# Patient Record
Sex: Female | Born: 1950 | Race: White | Hispanic: No
Health system: Southern US, Community
[De-identification: ages and names within clinical notes are randomized; demographics above are authoritative.]

---

## 2001-07-18 ENCOUNTER — Ambulatory Visit (HOSPITAL_COMMUNITY): Admission: RE | Admit: 2001-07-18 | Discharge: 2001-07-18 | Payer: Self-pay | Admitting: Family Medicine

## 2001-07-18 ENCOUNTER — Encounter: Payer: Self-pay | Admitting: Family Medicine

## 2003-05-21 ENCOUNTER — Ambulatory Visit (HOSPITAL_COMMUNITY): Admission: RE | Admit: 2003-05-21 | Discharge: 2003-05-21 | Payer: Self-pay | Admitting: Family Medicine

## 2003-05-21 ENCOUNTER — Encounter: Payer: Self-pay | Admitting: Family Medicine

## 2003-07-12 ENCOUNTER — Ambulatory Visit (HOSPITAL_COMMUNITY): Admission: RE | Admit: 2003-07-12 | Discharge: 2003-07-12 | Payer: Self-pay | Admitting: Family Medicine

## 2003-07-12 ENCOUNTER — Encounter: Payer: Self-pay | Admitting: Family Medicine

## 2004-07-28 ENCOUNTER — Other Ambulatory Visit: Admission: RE | Admit: 2004-07-28 | Discharge: 2004-07-28 | Payer: Self-pay | Admitting: Dermatology

## 2009-06-13 ENCOUNTER — Ambulatory Visit (HOSPITAL_COMMUNITY): Admission: RE | Admit: 2009-06-13 | Discharge: 2009-06-13 | Payer: Self-pay | Admitting: Family Medicine

## 2009-07-25 ENCOUNTER — Encounter: Payer: Self-pay | Admitting: Gastroenterology

## 2009-07-31 ENCOUNTER — Telehealth (INDEPENDENT_AMBULATORY_CARE_PROVIDER_SITE_OTHER): Payer: Self-pay

## 2009-12-10 ENCOUNTER — Ambulatory Visit (HOSPITAL_COMMUNITY): Admission: RE | Admit: 2009-12-10 | Discharge: 2009-12-10 | Payer: Self-pay | Admitting: Family Medicine

## 2010-07-07 ENCOUNTER — Encounter (INDEPENDENT_AMBULATORY_CARE_PROVIDER_SITE_OTHER): Payer: Self-pay | Admitting: *Deleted

## 2010-07-17 ENCOUNTER — Encounter: Admission: RE | Admit: 2010-07-17 | Discharge: 2010-07-17 | Payer: Self-pay | Admitting: Otolaryngology

## 2011-01-05 NOTE — Letter (Signed)
Summary: Previsit letter  [facility]  [address], [place] [address]   Phone: [phone]  Fax: [phone]       07/07/2010 MRN: [id number]  [facility] [address] [place], [place]  [address]  Dear Casey Hernandez,  Welcome to the Gastroenterology Division at [facility].    You are scheduled to see a nurse for your pre-procedure visit on 08-17-10 at 9:00a.m. on the 3rd floor at [facility], [address]  We ask that you try to arrive at our office 15 minutes prior to your appointment time to allow for check-in.  Your nurse visit will consist of discussing your medical and surgical history, your immediate family medical history, and your medications.    Please bring a complete list of all your medications or, if you prefer, bring the medication bottles and we will list them.  We will need to be aware of both prescribed and over the counter drugs.  We will need to know exact dosage information as well.  If you are on blood thinners (Coumadin, Plavix, Aggrenox, Ticlid, etc.) please call our office today/prior to your appointment, as we need to consult with your physician about holding your medication.   Please be prepared to read and sign documents such as consent forms, a financial agreement, and acknowledgement forms.  If necessary, and with your consent, a friend or relative is welcome to sit-in on the nurse visit with you.  Please bring your insurance card so that we may make a copy of it.  If your insurance requires a referral to see a specialist, please bring your referral form from your primary care physician.  No co-pay is required for this nurse visit.     If you cannot keep your appointment, please call [phone] to cancel or reschedule prior to your appointment date.  This allows [place] the opportunity to schedule an appointment for another patient in need of care.    Thank you for choosing [facility] for your medical needs.  We  appreciate the opportunity to care for you.  Please visit [place] at our website  to learn more about our practice.                     Sincerely.                                                                                                                   The Gastroenterology Division

## 2014-08-16 ENCOUNTER — Other Ambulatory Visit: Payer: Self-pay

## 2014-08-16 DIAGNOSIS — Z1231 Encounter for screening mammogram for malignant neoplasm of breast: Secondary | ICD-10-CM

## 2014-09-12 ENCOUNTER — Ambulatory Visit
Admission: RE | Admit: 2014-09-12 | Discharge: 2014-09-12 | Disposition: A | Discharge status: Home / Self Care | Payer: Medicare Other | Source: Ambulatory Visit

## 2014-09-12 DIAGNOSIS — Z1231 Encounter for screening mammogram for malignant neoplasm of breast: Secondary | ICD-10-CM | POA: Diagnosis not present

## 2015-03-11 DIAGNOSIS — Z0000 Encounter for general adult medical examination without abnormal findings: Secondary | ICD-10-CM | POA: Diagnosis not present

## 2015-03-11 DIAGNOSIS — Z6824 Body mass index (BMI) 24.0-24.9, adult: Secondary | ICD-10-CM | POA: Diagnosis not present

## 2015-03-19 ENCOUNTER — Encounter (INDEPENDENT_AMBULATORY_CARE_PROVIDER_SITE_OTHER): Payer: Self-pay | Admitting: *Deleted

## 2015-04-14 ENCOUNTER — Other Ambulatory Visit (HOSPITAL_COMMUNITY): Payer: Self-pay | Admitting: Family Medicine

## 2015-04-14 ENCOUNTER — Ambulatory Visit (HOSPITAL_COMMUNITY)
Admission: RE | Admit: 2015-04-14 | Discharge: 2015-04-14 | Disposition: A | Discharge status: Home / Self Care | Payer: Medicare Other | Source: Ambulatory Visit | Attending: Family Medicine | Admitting: Family Medicine

## 2015-04-14 DIAGNOSIS — M79641 Pain in right hand: Secondary | ICD-10-CM

## 2015-04-14 DIAGNOSIS — S62316A Displaced fracture of base of fifth metacarpal bone, right hand, initial encounter for closed fracture: Secondary | ICD-10-CM | POA: Diagnosis not present

## 2015-04-14 DIAGNOSIS — W19XXXA Unspecified fall, initial encounter: Secondary | ICD-10-CM | POA: Diagnosis not present

## 2015-04-14 DIAGNOSIS — E663 Overweight: Secondary | ICD-10-CM | POA: Diagnosis not present

## 2015-04-14 DIAGNOSIS — S62320A Displaced fracture of shaft of second metacarpal bone, right hand, initial encounter for closed fracture: Secondary | ICD-10-CM | POA: Diagnosis not present

## 2015-04-14 DIAGNOSIS — Z6825 Body mass index (BMI) 25.0-25.9, adult: Secondary | ICD-10-CM | POA: Diagnosis not present

## 2015-04-15 ENCOUNTER — Telehealth: Payer: Self-pay | Admitting: Orthopedic Surgery

## 2015-04-15 NOTE — Telephone Encounter (Signed)
i can see her tomorrow afternoon at 130

## 2015-04-15 NOTE — Telephone Encounter (Signed)
Referral received from primary care Dr. Molli Barrows, PA, for appointment as soon as possible, for problem hand fracture, 9-day old injury [place] beach.  Please review Xray films (done 04/14/15 at [facility]) and please advise regarding scheduling here (or hand specialist?)  Patient is currently in an ace bandage. Patient ph#'s [phone]/cell# [phone].

## 2015-04-15 NOTE — Telephone Encounter (Signed)
Spoke with patient scheduled appointment

## 2015-04-16 ENCOUNTER — Ambulatory Visit (INDEPENDENT_AMBULATORY_CARE_PROVIDER_SITE_OTHER): Payer: Medicare Other | Admitting: Orthopedic Surgery

## 2015-04-16 ENCOUNTER — Encounter: Payer: Self-pay | Admitting: Orthopedic Surgery

## 2015-04-16 VITALS — BP 156/82 | Ht 62.0 in | Wt 140.0 lb

## 2015-04-16 DIAGNOSIS — S62309A Unspecified fracture of unspecified metacarpal bone, initial encounter for closed fracture: Secondary | ICD-10-CM | POA: Diagnosis not present

## 2015-04-16 NOTE — Progress Notes (Signed)
Patient ID: Casey Hernandez, female   DOB: 1951-01-12, 64 y.o.   MRN: [id number]  Chief Complaint  Patient presents with  . Hand Injury    right hand fracture, DOI 04/05/15, REF FUSCO    History: See right hand injury on April 30. Patient fell over a [employer] executive. Landed on her right hand. Complains of constant sharp throbbing pain associated with swelling. She fell again on the ninth and had an x-ray and it showed a fracture at the base of the fifth metacarpal.  Review of Systems  All other systems reviewed and are negative.  She reports no medical history  In the late 90s she had surgery for ruptured disc in her neck  She's not on any medication  Codeine causes her to have up stat stomach and causes a feeling that her skin has something crawling on  Family history diabetes reflux heart failure hypertension and cancer  Social history denies smoking or drinking  She has stable vital signs she is awake alert and oriented 3 mood and affect are normal. Her ambulatory status is not in question but normal.  She has swelling and bruising over the dorsum right hand with tenderness at the carpometacarpal joint. But the joint is stable. Her motor functions intact her skin is bruised but intact other than a abrasion near the distal aspect of the metacarpal which has healed nicely. Sensation is normal to soft touch and she has good perfusion pulse and color. Epitrochlear lymph nodes are normal  X-rays were done at our [facility] in the shoulder base of the fifth metacarpal fracture  We put her in a warm and form multiple cast brace allow full range of motion x-rays in 3 weeks

## 2015-05-02 ENCOUNTER — Other Ambulatory Visit (HOSPITAL_COMMUNITY): Payer: Self-pay | Admitting: Family Medicine

## 2015-05-02 DIAGNOSIS — Z780 Asymptomatic menopausal state: Secondary | ICD-10-CM

## 2015-05-02 DIAGNOSIS — M8580 Other specified disorders of bone density and structure, unspecified site: Secondary | ICD-10-CM

## 2015-05-08 ENCOUNTER — Ambulatory Visit (INDEPENDENT_AMBULATORY_CARE_PROVIDER_SITE_OTHER): Payer: Medicare Other

## 2015-05-08 ENCOUNTER — Ambulatory Visit (INDEPENDENT_AMBULATORY_CARE_PROVIDER_SITE_OTHER): Payer: Self-pay | Admitting: Orthopedic Surgery

## 2015-05-08 VITALS — BP 136/75 | Ht 62.0 in | Wt 140.0 lb

## 2015-05-08 DIAGNOSIS — S62609D Fracture of unspecified phalanx of unspecified finger, subsequent encounter for fracture with routine healing: Secondary | ICD-10-CM | POA: Diagnosis not present

## 2015-05-08 DIAGNOSIS — S62318D Displaced fracture of base of other metacarpal bone, subsequent encounter for fracture with routine healing: Secondary | ICD-10-CM

## 2015-05-08 NOTE — Progress Notes (Signed)
Patient ID: Casey Hernandez, female   DOB: [birth year], 64 y.o.   MRN: [id number]  Chief Complaint  Patient presents with  . Follow-up    3 week recheck on right hand fracture with xray, DOI 04-05-15.    BP 136/75 mmHg  Ht 5\' 2"  (1.575 m)  Wt 140 lb (63.504 kg)  BMI 25.60 kg/m2  Encounter Diagnosis  Name Primary?  . Closed fracture of base of fifth metacarpal bone, with routine healing, subsequent encounter Yes    She still a little tender at the fracture site has full range of motion after being treated with a fracture multiple brace  X-rays were reviewed and comments and report are in the chart. She can remove the brace follow-up as needed don't lift anything heavy over the next 2 weeks.

## 2015-05-09 ENCOUNTER — Ambulatory Visit (HOSPITAL_COMMUNITY)
Admission: RE | Admit: 2015-05-09 | Discharge: 2015-05-09 | Disposition: A | Discharge status: Home / Self Care | Payer: Medicare Other | Source: Ambulatory Visit | Attending: Family Medicine | Admitting: Family Medicine

## 2015-05-09 DIAGNOSIS — Z780 Asymptomatic menopausal state: Secondary | ICD-10-CM | POA: Diagnosis not present

## 2015-05-09 DIAGNOSIS — M8580 Other specified disorders of bone density and structure, unspecified site: Secondary | ICD-10-CM | POA: Diagnosis not present

## 2015-05-09 DIAGNOSIS — M810 Age-related osteoporosis without current pathological fracture: Secondary | ICD-10-CM | POA: Diagnosis not present

## 2016-02-20 ENCOUNTER — Other Ambulatory Visit: Payer: Self-pay

## 2016-02-20 DIAGNOSIS — Z1231 Encounter for screening mammogram for malignant neoplasm of breast: Secondary | ICD-10-CM

## 2016-03-08 ENCOUNTER — Ambulatory Visit
Admission: RE | Admit: 2016-03-08 | Discharge: 2016-03-08 | Disposition: A | Discharge status: Home / Self Care | Payer: Medicare Other | Source: Ambulatory Visit

## 2016-03-08 DIAGNOSIS — Z1231 Encounter for screening mammogram for malignant neoplasm of breast: Secondary | ICD-10-CM | POA: Diagnosis not present

## 2016-03-08 IMAGING — MG MM SCREENING BREAST TOMO BILATERAL
8 of 12 series · 8 of 28 positions shown · non-contrast
Comparison: Previous exam(s).

CLINICAL DATA: Screening.

EXAM:
2D DIGITAL SCREENING BILATERAL MAMMOGRAM WITH CAD AND ADJUNCT TOMO

[L MLO synth-2D]
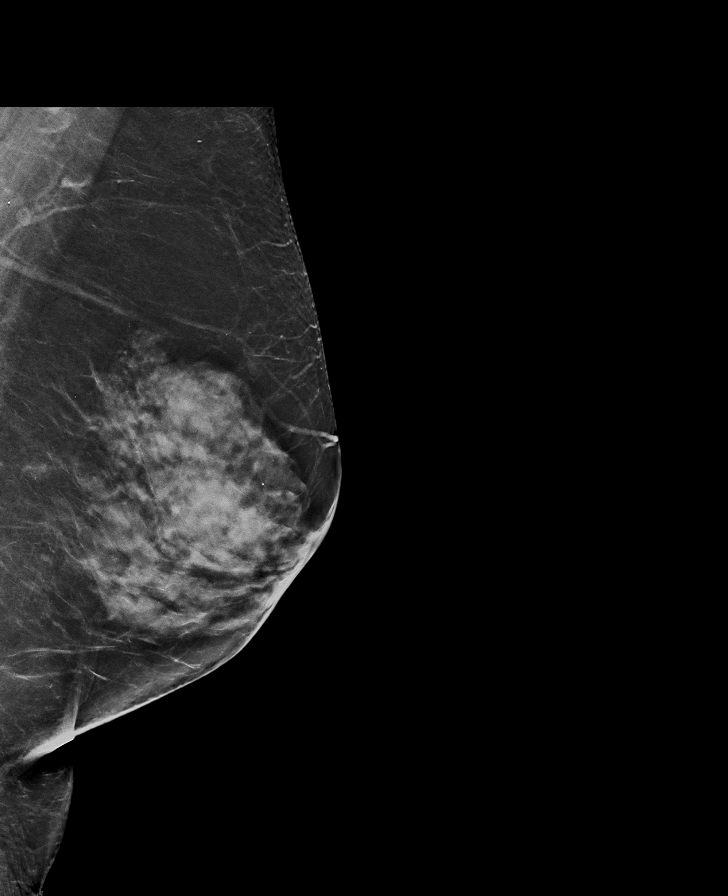

[L MLO]
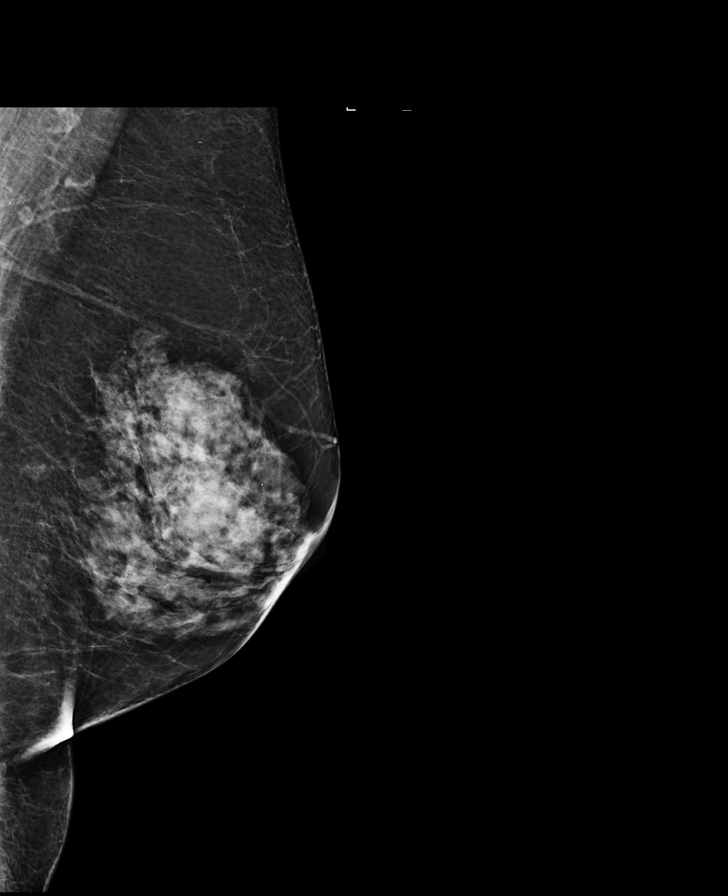

[L CC synth-2D]
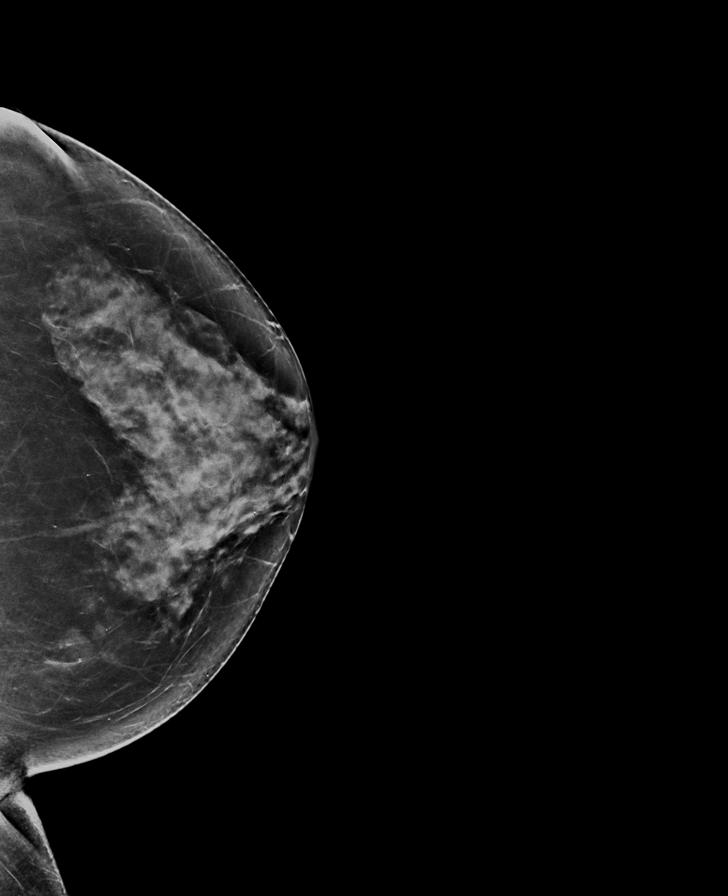

[R CC synth-2D]
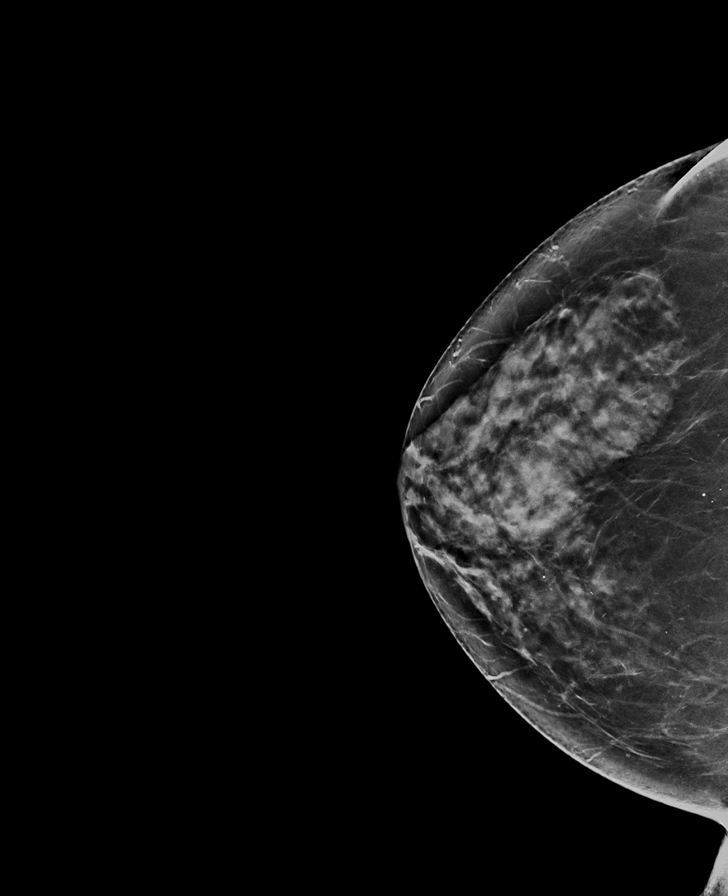

[L CC]
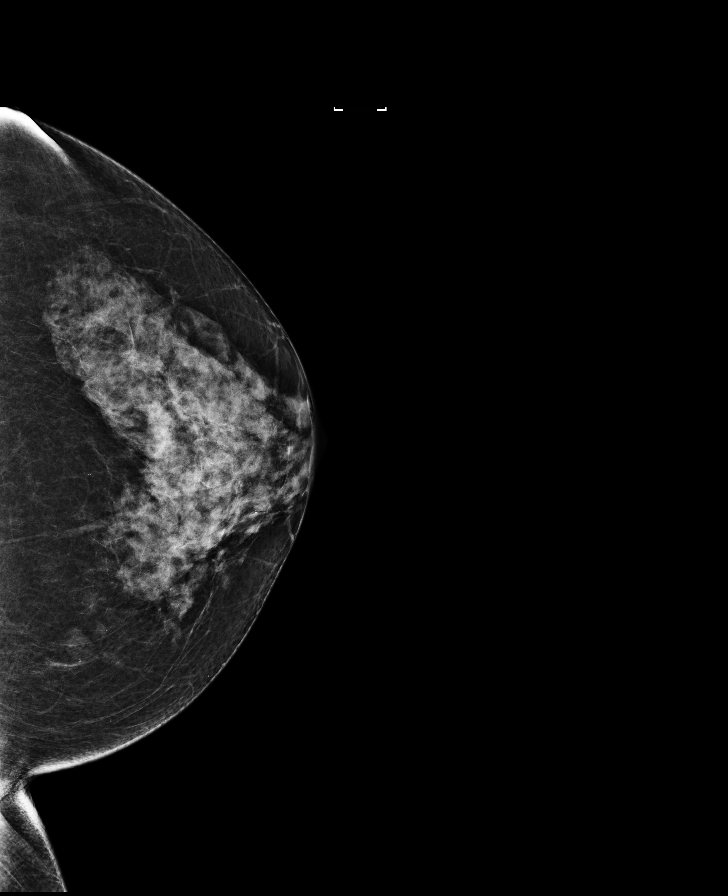

[R MLO]
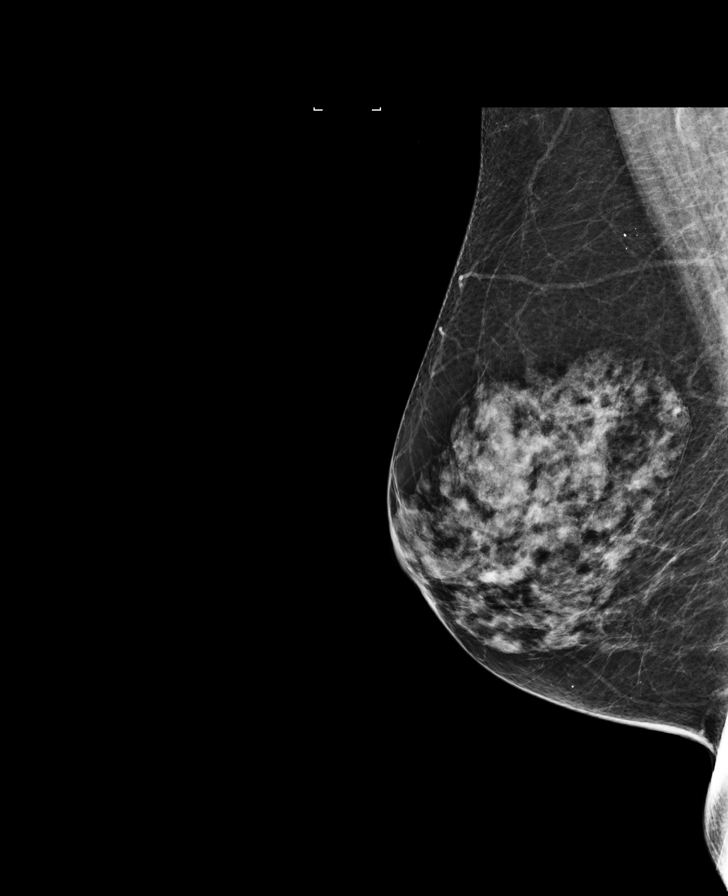

[R MLO synth-2D]
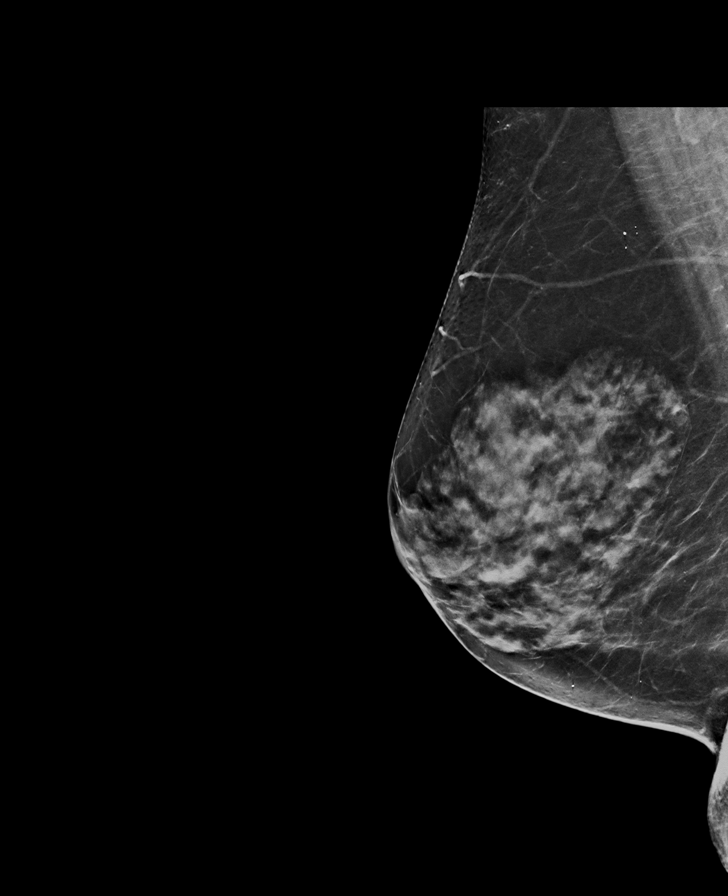

[R CC]
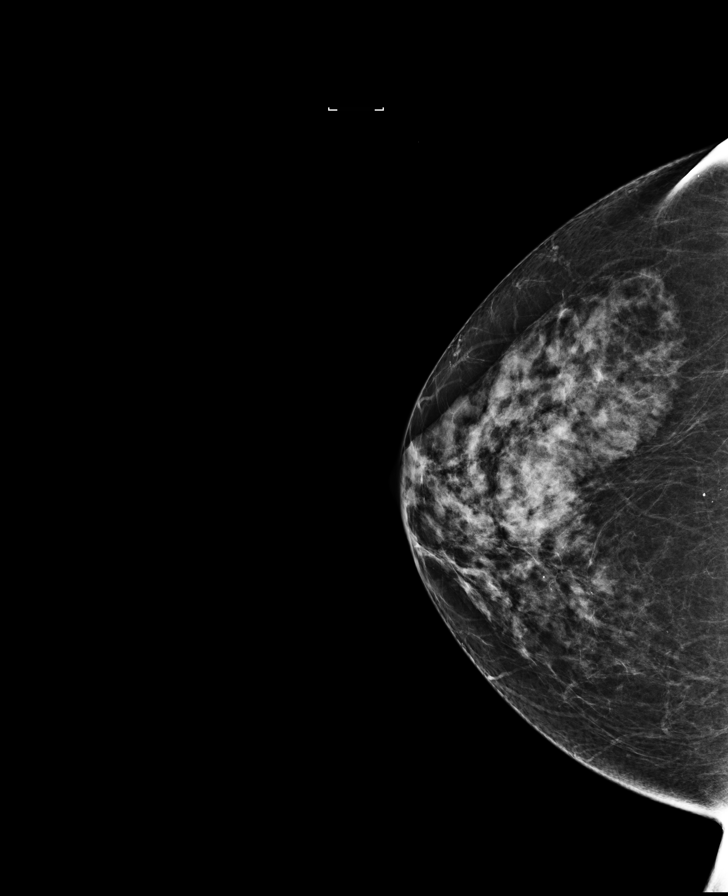

[8 of 28 positions shown; findings below may reference images not displayed]

ACR Breast Density Category c: The breast tissue is heterogeneously
dense, which may obscure small masses.
FINDINGS: There are no findings suspicious for malignancy. Images were
processed with CAD.
IMPRESSION: No mammographic evidence of malignancy. A result letter of this
screening mammogram will be mailed directly to the patient.

RECOMMENDATION:
Screening mammogram in one year. (Code:[TA])

BI-RADS CATEGORY  1: Negative.

## 2016-04-01 DIAGNOSIS — Z6825 Body mass index (BMI) 25.0-25.9, adult: Secondary | ICD-10-CM | POA: Diagnosis not present

## 2016-04-01 DIAGNOSIS — E663 Overweight: Secondary | ICD-10-CM | POA: Diagnosis not present

## 2016-04-01 DIAGNOSIS — Z1389 Encounter for screening for other disorder: Secondary | ICD-10-CM | POA: Diagnosis not present

## 2016-04-01 DIAGNOSIS — Z0001 Encounter for general adult medical examination with abnormal findings: Secondary | ICD-10-CM | POA: Diagnosis not present

## 2016-04-01 DIAGNOSIS — D485 Neoplasm of uncertain behavior of skin: Secondary | ICD-10-CM | POA: Diagnosis not present

## 2016-05-25 DIAGNOSIS — Z85828 Personal history of other malignant neoplasm of skin: Secondary | ICD-10-CM | POA: Diagnosis not present

## 2016-05-25 DIAGNOSIS — L814 Other melanin hyperpigmentation: Secondary | ICD-10-CM | POA: Diagnosis not present

## 2016-05-25 DIAGNOSIS — D485 Neoplasm of uncertain behavior of skin: Secondary | ICD-10-CM | POA: Diagnosis not present

## 2016-05-25 DIAGNOSIS — C44319 Basal cell carcinoma of skin of other parts of face: Secondary | ICD-10-CM | POA: Diagnosis not present

## 2016-05-25 DIAGNOSIS — L570 Actinic keratosis: Secondary | ICD-10-CM | POA: Diagnosis not present

## 2016-08-11 DIAGNOSIS — C44319 Basal cell carcinoma of skin of other parts of face: Secondary | ICD-10-CM | POA: Diagnosis not present

## 2017-04-21 DIAGNOSIS — Z1389 Encounter for screening for other disorder: Secondary | ICD-10-CM | POA: Diagnosis not present

## 2017-04-21 DIAGNOSIS — Z6824 Body mass index (BMI) 24.0-24.9, adult: Secondary | ICD-10-CM | POA: Diagnosis not present

## 2017-04-21 DIAGNOSIS — Z1211 Encounter for screening for malignant neoplasm of colon: Secondary | ICD-10-CM | POA: Diagnosis not present

## 2017-04-21 DIAGNOSIS — D485 Neoplasm of uncertain behavior of skin: Secondary | ICD-10-CM | POA: Diagnosis not present

## 2017-05-17 DIAGNOSIS — Z1231 Encounter for screening mammogram for malignant neoplasm of breast: Secondary | ICD-10-CM | POA: Diagnosis not present

## 2017-05-26 DIAGNOSIS — Z6822 Body mass index (BMI) 22.0-22.9, adult: Secondary | ICD-10-CM | POA: Diagnosis not present

## 2017-05-26 DIAGNOSIS — L850 Acquired ichthyosis: Secondary | ICD-10-CM | POA: Diagnosis not present

## 2017-05-26 DIAGNOSIS — Z1211 Encounter for screening for malignant neoplasm of colon: Secondary | ICD-10-CM | POA: Diagnosis not present

## 2017-06-03 DIAGNOSIS — L850 Acquired ichthyosis: Secondary | ICD-10-CM | POA: Diagnosis not present

## 2017-06-07 DIAGNOSIS — L280 Lichen simplex chronicus: Secondary | ICD-10-CM | POA: Diagnosis not present

## 2017-06-07 DIAGNOSIS — L299 Pruritus, unspecified: Secondary | ICD-10-CM | POA: Diagnosis not present

## 2017-06-07 DIAGNOSIS — L309 Dermatitis, unspecified: Secondary | ICD-10-CM | POA: Diagnosis not present

## 2017-06-07 DIAGNOSIS — D485 Neoplasm of uncertain behavior of skin: Secondary | ICD-10-CM | POA: Diagnosis not present

## 2017-06-17 DIAGNOSIS — E782 Mixed hyperlipidemia: Secondary | ICD-10-CM | POA: Diagnosis not present

## 2017-06-17 DIAGNOSIS — Z6823 Body mass index (BMI) 23.0-23.9, adult: Secondary | ICD-10-CM | POA: Diagnosis not present

## 2017-06-17 DIAGNOSIS — Z0001 Encounter for general adult medical examination with abnormal findings: Secondary | ICD-10-CM | POA: Diagnosis not present

## 2017-06-17 DIAGNOSIS — Z1389 Encounter for screening for other disorder: Secondary | ICD-10-CM | POA: Diagnosis not present

## 2017-06-17 DIAGNOSIS — L309 Dermatitis, unspecified: Secondary | ICD-10-CM | POA: Diagnosis not present

## 2017-06-30 DIAGNOSIS — L280 Lichen simplex chronicus: Secondary | ICD-10-CM | POA: Diagnosis not present

## 2017-06-30 DIAGNOSIS — L309 Dermatitis, unspecified: Secondary | ICD-10-CM | POA: Diagnosis not present

## 2017-07-25 DIAGNOSIS — R69 Illness, unspecified: Secondary | ICD-10-CM | POA: Diagnosis not present

## 2017-07-26 DIAGNOSIS — Z6824 Body mass index (BMI) 24.0-24.9, adult: Secondary | ICD-10-CM | POA: Diagnosis not present

## 2017-07-26 DIAGNOSIS — D509 Iron deficiency anemia, unspecified: Secondary | ICD-10-CM | POA: Diagnosis not present

## 2017-07-26 DIAGNOSIS — R7989 Other specified abnormal findings of blood chemistry: Secondary | ICD-10-CM | POA: Diagnosis not present

## 2017-07-26 DIAGNOSIS — L209 Atopic dermatitis, unspecified: Secondary | ICD-10-CM | POA: Diagnosis not present

## 2017-07-29 DIAGNOSIS — Z1211 Encounter for screening for malignant neoplasm of colon: Secondary | ICD-10-CM | POA: Diagnosis not present

## 2017-08-15 DIAGNOSIS — L570 Actinic keratosis: Secondary | ICD-10-CM | POA: Diagnosis not present

## 2017-08-15 DIAGNOSIS — L280 Lichen simplex chronicus: Secondary | ICD-10-CM | POA: Diagnosis not present

## 2017-09-01 DIAGNOSIS — Z6824 Body mass index (BMI) 24.0-24.9, adult: Secondary | ICD-10-CM | POA: Diagnosis not present

## 2017-09-01 DIAGNOSIS — L308 Other specified dermatitis: Secondary | ICD-10-CM | POA: Diagnosis not present

## 2017-09-08 DIAGNOSIS — L2089 Other atopic dermatitis: Secondary | ICD-10-CM | POA: Diagnosis not present

## 2017-09-08 DIAGNOSIS — D692 Other nonthrombocytopenic purpura: Secondary | ICD-10-CM | POA: Diagnosis not present

## 2017-10-24 DIAGNOSIS — D692 Other nonthrombocytopenic purpura: Secondary | ICD-10-CM | POA: Diagnosis not present

## 2017-10-24 DIAGNOSIS — R21 Rash and other nonspecific skin eruption: Secondary | ICD-10-CM | POA: Diagnosis not present

## 2017-10-24 DIAGNOSIS — L570 Actinic keratosis: Secondary | ICD-10-CM | POA: Diagnosis not present

## 2017-10-24 DIAGNOSIS — L308 Other specified dermatitis: Secondary | ICD-10-CM | POA: Diagnosis not present

## 2017-11-11 DIAGNOSIS — L821 Other seborrheic keratosis: Secondary | ICD-10-CM | POA: Diagnosis not present

## 2017-11-11 DIAGNOSIS — L309 Dermatitis, unspecified: Secondary | ICD-10-CM | POA: Diagnosis not present

## 2017-11-11 DIAGNOSIS — L820 Inflamed seborrheic keratosis: Secondary | ICD-10-CM | POA: Diagnosis not present

## 2017-12-12 DIAGNOSIS — L309 Dermatitis, unspecified: Secondary | ICD-10-CM | POA: Diagnosis not present

## 2017-12-12 DIAGNOSIS — L2089 Other atopic dermatitis: Secondary | ICD-10-CM | POA: Diagnosis not present

## 2017-12-12 DIAGNOSIS — C44722 Squamous cell carcinoma of skin of right lower limb, including hip: Secondary | ICD-10-CM | POA: Diagnosis not present

## 2017-12-12 DIAGNOSIS — L820 Inflamed seborrheic keratosis: Secondary | ICD-10-CM | POA: Diagnosis not present

## 2017-12-12 DIAGNOSIS — C44729 Squamous cell carcinoma of skin of left lower limb, including hip: Secondary | ICD-10-CM | POA: Diagnosis not present

## 2017-12-14 DIAGNOSIS — L2089 Other atopic dermatitis: Secondary | ICD-10-CM | POA: Diagnosis not present

## 2017-12-21 DIAGNOSIS — L2089 Other atopic dermatitis: Secondary | ICD-10-CM | POA: Diagnosis not present

## 2017-12-26 DIAGNOSIS — L2089 Other atopic dermatitis: Secondary | ICD-10-CM | POA: Diagnosis not present

## 2017-12-28 DIAGNOSIS — L2089 Other atopic dermatitis: Secondary | ICD-10-CM | POA: Diagnosis not present

## 2018-01-02 DIAGNOSIS — L2089 Other atopic dermatitis: Secondary | ICD-10-CM | POA: Diagnosis not present

## 2018-01-04 DIAGNOSIS — L2089 Other atopic dermatitis: Secondary | ICD-10-CM | POA: Diagnosis not present

## 2018-01-09 DIAGNOSIS — L2089 Other atopic dermatitis: Secondary | ICD-10-CM | POA: Diagnosis not present

## 2018-01-09 DIAGNOSIS — Z85828 Personal history of other malignant neoplasm of skin: Secondary | ICD-10-CM | POA: Diagnosis not present

## 2018-01-11 DIAGNOSIS — L2089 Other atopic dermatitis: Secondary | ICD-10-CM | POA: Diagnosis not present

## 2018-01-16 DIAGNOSIS — L2089 Other atopic dermatitis: Secondary | ICD-10-CM | POA: Diagnosis not present

## 2018-01-18 DIAGNOSIS — L2089 Other atopic dermatitis: Secondary | ICD-10-CM | POA: Diagnosis not present

## 2018-01-23 DIAGNOSIS — L2089 Other atopic dermatitis: Secondary | ICD-10-CM | POA: Diagnosis not present

## 2018-01-30 DIAGNOSIS — L2089 Other atopic dermatitis: Secondary | ICD-10-CM | POA: Diagnosis not present

## 2018-02-01 DIAGNOSIS — L2089 Other atopic dermatitis: Secondary | ICD-10-CM | POA: Diagnosis not present

## 2018-02-06 DIAGNOSIS — Z85828 Personal history of other malignant neoplasm of skin: Secondary | ICD-10-CM | POA: Diagnosis not present

## 2018-02-06 DIAGNOSIS — L2089 Other atopic dermatitis: Secondary | ICD-10-CM | POA: Diagnosis not present

## 2018-02-08 DIAGNOSIS — L2089 Other atopic dermatitis: Secondary | ICD-10-CM | POA: Diagnosis not present

## 2018-02-13 DIAGNOSIS — L2089 Other atopic dermatitis: Secondary | ICD-10-CM | POA: Diagnosis not present

## 2018-02-15 DIAGNOSIS — L2089 Other atopic dermatitis: Secondary | ICD-10-CM | POA: Diagnosis not present

## 2018-02-20 DIAGNOSIS — L2089 Other atopic dermatitis: Secondary | ICD-10-CM | POA: Diagnosis not present

## 2018-02-22 DIAGNOSIS — L2089 Other atopic dermatitis: Secondary | ICD-10-CM | POA: Diagnosis not present

## 2018-02-27 DIAGNOSIS — L2089 Other atopic dermatitis: Secondary | ICD-10-CM | POA: Diagnosis not present

## 2018-03-01 DIAGNOSIS — L2089 Other atopic dermatitis: Secondary | ICD-10-CM | POA: Diagnosis not present

## 2018-03-06 DIAGNOSIS — L2089 Other atopic dermatitis: Secondary | ICD-10-CM | POA: Diagnosis not present

## 2018-03-08 DIAGNOSIS — L2089 Other atopic dermatitis: Secondary | ICD-10-CM | POA: Diagnosis not present

## 2018-03-13 DIAGNOSIS — L2089 Other atopic dermatitis: Secondary | ICD-10-CM | POA: Diagnosis not present

## 2018-03-15 DIAGNOSIS — L2089 Other atopic dermatitis: Secondary | ICD-10-CM | POA: Diagnosis not present

## 2018-03-22 DIAGNOSIS — L2089 Other atopic dermatitis: Secondary | ICD-10-CM | POA: Diagnosis not present

## 2018-03-27 DIAGNOSIS — L2089 Other atopic dermatitis: Secondary | ICD-10-CM | POA: Diagnosis not present

## 2018-03-29 DIAGNOSIS — L2089 Other atopic dermatitis: Secondary | ICD-10-CM | POA: Diagnosis not present

## 2018-04-10 DIAGNOSIS — L2089 Other atopic dermatitis: Secondary | ICD-10-CM | POA: Diagnosis not present

## 2018-04-10 DIAGNOSIS — Z85828 Personal history of other malignant neoplasm of skin: Secondary | ICD-10-CM | POA: Diagnosis not present

## 2018-04-10 DIAGNOSIS — L308 Other specified dermatitis: Secondary | ICD-10-CM | POA: Diagnosis not present

## 2018-04-10 DIAGNOSIS — Z79899 Other long term (current) drug therapy: Secondary | ICD-10-CM | POA: Diagnosis not present

## 2018-04-10 DIAGNOSIS — L309 Dermatitis, unspecified: Secondary | ICD-10-CM | POA: Diagnosis not present

## 2018-04-24 DIAGNOSIS — Z4802 Encounter for removal of sutures: Secondary | ICD-10-CM | POA: Diagnosis not present

## 2018-04-24 DIAGNOSIS — L298 Other pruritus: Secondary | ICD-10-CM | POA: Diagnosis not present

## 2018-05-22 DIAGNOSIS — L2089 Other atopic dermatitis: Secondary | ICD-10-CM | POA: Diagnosis not present

## 2018-05-22 DIAGNOSIS — Z85828 Personal history of other malignant neoplasm of skin: Secondary | ICD-10-CM | POA: Diagnosis not present

## 2018-05-22 DIAGNOSIS — Z79899 Other long term (current) drug therapy: Secondary | ICD-10-CM | POA: Diagnosis not present

## 2018-06-19 DIAGNOSIS — Z79899 Other long term (current) drug therapy: Secondary | ICD-10-CM | POA: Diagnosis not present

## 2018-06-19 DIAGNOSIS — Z85828 Personal history of other malignant neoplasm of skin: Secondary | ICD-10-CM | POA: Diagnosis not present

## 2018-06-19 DIAGNOSIS — L2089 Other atopic dermatitis: Secondary | ICD-10-CM | POA: Diagnosis not present

## 2018-07-24 DIAGNOSIS — Z79899 Other long term (current) drug therapy: Secondary | ICD-10-CM | POA: Diagnosis not present

## 2018-07-24 DIAGNOSIS — L2089 Other atopic dermatitis: Secondary | ICD-10-CM | POA: Diagnosis not present

## 2018-07-24 DIAGNOSIS — Z85828 Personal history of other malignant neoplasm of skin: Secondary | ICD-10-CM | POA: Diagnosis not present

## 2018-08-22 DIAGNOSIS — Z85828 Personal history of other malignant neoplasm of skin: Secondary | ICD-10-CM | POA: Diagnosis not present

## 2018-08-22 DIAGNOSIS — Z79899 Other long term (current) drug therapy: Secondary | ICD-10-CM | POA: Diagnosis not present

## 2018-08-22 DIAGNOSIS — L2089 Other atopic dermatitis: Secondary | ICD-10-CM | POA: Diagnosis not present

## 2018-11-06 DIAGNOSIS — L218 Other seborrheic dermatitis: Secondary | ICD-10-CM | POA: Diagnosis not present

## 2018-11-06 DIAGNOSIS — L2089 Other atopic dermatitis: Secondary | ICD-10-CM | POA: Diagnosis not present

## 2018-11-06 DIAGNOSIS — Z85828 Personal history of other malignant neoplasm of skin: Secondary | ICD-10-CM | POA: Diagnosis not present

## 2018-11-06 DIAGNOSIS — Z79899 Other long term (current) drug therapy: Secondary | ICD-10-CM | POA: Diagnosis not present

## 2018-11-10 DIAGNOSIS — E663 Overweight: Secondary | ICD-10-CM | POA: Diagnosis not present

## 2018-11-10 DIAGNOSIS — Z6825 Body mass index (BMI) 25.0-25.9, adult: Secondary | ICD-10-CM | POA: Diagnosis not present

## 2018-11-10 DIAGNOSIS — Z0000 Encounter for general adult medical examination without abnormal findings: Secondary | ICD-10-CM | POA: Diagnosis not present

## 2018-11-10 DIAGNOSIS — Z1389 Encounter for screening for other disorder: Secondary | ICD-10-CM | POA: Diagnosis not present

## 2018-11-10 DIAGNOSIS — Z0001 Encounter for general adult medical examination with abnormal findings: Secondary | ICD-10-CM | POA: Diagnosis not present

## 2018-11-10 DIAGNOSIS — L309 Dermatitis, unspecified: Secondary | ICD-10-CM | POA: Diagnosis not present

## 2019-01-29 DIAGNOSIS — Z79899 Other long term (current) drug therapy: Secondary | ICD-10-CM | POA: Diagnosis not present

## 2019-01-29 DIAGNOSIS — L718 Other rosacea: Secondary | ICD-10-CM | POA: Diagnosis not present

## 2019-01-29 DIAGNOSIS — L209 Atopic dermatitis, unspecified: Secondary | ICD-10-CM | POA: Diagnosis not present

## 2019-01-29 DIAGNOSIS — Z85828 Personal history of other malignant neoplasm of skin: Secondary | ICD-10-CM | POA: Diagnosis not present

## 2019-01-29 DIAGNOSIS — L2089 Other atopic dermatitis: Secondary | ICD-10-CM | POA: Diagnosis not present

## 2019-05-21 DIAGNOSIS — L2089 Other atopic dermatitis: Secondary | ICD-10-CM | POA: Diagnosis not present

## 2019-05-21 DIAGNOSIS — Z85828 Personal history of other malignant neoplasm of skin: Secondary | ICD-10-CM | POA: Diagnosis not present

## 2019-05-21 DIAGNOSIS — Z79899 Other long term (current) drug therapy: Secondary | ICD-10-CM | POA: Diagnosis not present

## 2019-06-07 DIAGNOSIS — Z6824 Body mass index (BMI) 24.0-24.9, adult: Secondary | ICD-10-CM | POA: Diagnosis not present

## 2019-06-07 DIAGNOSIS — Z1389 Encounter for screening for other disorder: Secondary | ICD-10-CM | POA: Diagnosis not present

## 2019-06-07 DIAGNOSIS — M810 Age-related osteoporosis without current pathological fracture: Secondary | ICD-10-CM | POA: Diagnosis not present

## 2019-06-07 DIAGNOSIS — Z0001 Encounter for general adult medical examination with abnormal findings: Secondary | ICD-10-CM | POA: Diagnosis not present

## 2019-07-26 DIAGNOSIS — H00015 Hordeolum externum left lower eyelid: Secondary | ICD-10-CM | POA: Diagnosis not present

## 2019-07-26 DIAGNOSIS — Z6824 Body mass index (BMI) 24.0-24.9, adult: Secondary | ICD-10-CM | POA: Diagnosis not present

## 2019-07-27 DIAGNOSIS — Z1231 Encounter for screening mammogram for malignant neoplasm of breast: Secondary | ICD-10-CM | POA: Diagnosis not present

## 2019-08-14 DIAGNOSIS — L710 Perioral dermatitis: Secondary | ICD-10-CM | POA: Diagnosis not present

## 2019-08-14 DIAGNOSIS — Z79899 Other long term (current) drug therapy: Secondary | ICD-10-CM | POA: Diagnosis not present

## 2019-08-14 DIAGNOSIS — L2089 Other atopic dermatitis: Secondary | ICD-10-CM | POA: Diagnosis not present

## 2019-08-14 DIAGNOSIS — Z85828 Personal history of other malignant neoplasm of skin: Secondary | ICD-10-CM | POA: Diagnosis not present

## 2019-11-20 DIAGNOSIS — L2089 Other atopic dermatitis: Secondary | ICD-10-CM | POA: Diagnosis not present

## 2019-11-20 DIAGNOSIS — Z85828 Personal history of other malignant neoplasm of skin: Secondary | ICD-10-CM | POA: Diagnosis not present

## 2019-11-20 DIAGNOSIS — Z79899 Other long term (current) drug therapy: Secondary | ICD-10-CM | POA: Diagnosis not present

## 2022-02-04 DIAGNOSIS — L2089 Other atopic dermatitis: Secondary | ICD-10-CM | POA: Diagnosis not present

## 2022-02-04 DIAGNOSIS — Z79899 Other long term (current) drug therapy: Secondary | ICD-10-CM | POA: Diagnosis not present

## 2022-02-04 DIAGNOSIS — Z85828 Personal history of other malignant neoplasm of skin: Secondary | ICD-10-CM | POA: Diagnosis not present

## 2022-03-17 DIAGNOSIS — X58XXXA Exposure to other specified factors, initial encounter: Secondary | ICD-10-CM | POA: Diagnosis not present

## 2022-03-17 DIAGNOSIS — R112 Nausea with vomiting, unspecified: Secondary | ICD-10-CM | POA: Diagnosis not present

## 2022-03-17 DIAGNOSIS — R0600 Dyspnea, unspecified: Secondary | ICD-10-CM | POA: Diagnosis not present

## 2022-03-17 DIAGNOSIS — R42 Dizziness and giddiness: Secondary | ICD-10-CM | POA: Diagnosis not present

## 2022-03-17 DIAGNOSIS — R5383 Other fatigue: Secondary | ICD-10-CM | POA: Diagnosis not present

## 2022-03-17 DIAGNOSIS — S0990XA Unspecified injury of head, initial encounter: Secondary | ICD-10-CM | POA: Diagnosis not present

## 2022-03-17 DIAGNOSIS — R519 Headache, unspecified: Secondary | ICD-10-CM | POA: Diagnosis not present

## 2022-03-17 DIAGNOSIS — Z9989 Dependence on other enabling machines and devices: Secondary | ICD-10-CM | POA: Diagnosis not present

## 2022-03-17 DIAGNOSIS — K529 Noninfective gastroenteritis and colitis, unspecified: Secondary | ICD-10-CM | POA: Diagnosis not present

## 2022-03-22 DIAGNOSIS — R42 Dizziness and giddiness: Secondary | ICD-10-CM | POA: Diagnosis not present

## 2022-03-22 DIAGNOSIS — H6120 Impacted cerumen, unspecified ear: Secondary | ICD-10-CM | POA: Diagnosis not present

## 2022-03-22 DIAGNOSIS — H9201 Otalgia, right ear: Secondary | ICD-10-CM | POA: Diagnosis not present

## 2022-03-23 DIAGNOSIS — H8113 Benign paroxysmal vertigo, bilateral: Secondary | ICD-10-CM | POA: Diagnosis not present

## 2022-04-08 DIAGNOSIS — Z85828 Personal history of other malignant neoplasm of skin: Secondary | ICD-10-CM | POA: Diagnosis not present

## 2022-04-08 DIAGNOSIS — D692 Other nonthrombocytopenic purpura: Secondary | ICD-10-CM | POA: Diagnosis not present

## 2022-04-08 DIAGNOSIS — Z79899 Other long term (current) drug therapy: Secondary | ICD-10-CM | POA: Diagnosis not present

## 2022-04-08 DIAGNOSIS — L2089 Other atopic dermatitis: Secondary | ICD-10-CM | POA: Diagnosis not present

## 2022-04-21 DIAGNOSIS — D649 Anemia, unspecified: Secondary | ICD-10-CM | POA: Diagnosis not present

## 2022-04-21 DIAGNOSIS — R42 Dizziness and giddiness: Secondary | ICD-10-CM | POA: Diagnosis not present

## 2022-04-21 DIAGNOSIS — L209 Atopic dermatitis, unspecified: Secondary | ICD-10-CM | POA: Diagnosis not present

## 2022-04-26 DIAGNOSIS — L209 Atopic dermatitis, unspecified: Secondary | ICD-10-CM | POA: Diagnosis not present

## 2022-04-26 DIAGNOSIS — D649 Anemia, unspecified: Secondary | ICD-10-CM | POA: Diagnosis not present

## 2022-04-26 DIAGNOSIS — R42 Dizziness and giddiness: Secondary | ICD-10-CM | POA: Diagnosis not present

## 2022-07-08 DIAGNOSIS — H25813 Combined forms of age-related cataract, bilateral: Secondary | ICD-10-CM | POA: Diagnosis not present

## 2022-07-08 DIAGNOSIS — R42 Dizziness and giddiness: Secondary | ICD-10-CM | POA: Diagnosis not present

## 2022-07-08 DIAGNOSIS — H43813 Vitreous degeneration, bilateral: Secondary | ICD-10-CM | POA: Diagnosis not present

## 2022-07-26 DIAGNOSIS — L2089 Other atopic dermatitis: Secondary | ICD-10-CM | POA: Diagnosis not present

## 2022-07-26 DIAGNOSIS — Z1283 Encounter for screening for malignant neoplasm of skin: Secondary | ICD-10-CM | POA: Diagnosis not present

## 2022-07-26 DIAGNOSIS — L249 Irritant contact dermatitis, unspecified cause: Secondary | ICD-10-CM | POA: Diagnosis not present

## 2022-08-03 DIAGNOSIS — R55 Syncope and collapse: Secondary | ICD-10-CM | POA: Diagnosis not present

## 2022-08-03 DIAGNOSIS — D649 Anemia, unspecified: Secondary | ICD-10-CM | POA: Diagnosis not present

## 2022-08-03 DIAGNOSIS — R42 Dizziness and giddiness: Secondary | ICD-10-CM | POA: Diagnosis not present

## 2022-08-12 DIAGNOSIS — R55 Syncope and collapse: Secondary | ICD-10-CM | POA: Diagnosis not present

## 2022-08-19 DIAGNOSIS — R55 Syncope and collapse: Secondary | ICD-10-CM | POA: Diagnosis not present

## 2022-08-27 DIAGNOSIS — R55 Syncope and collapse: Secondary | ICD-10-CM | POA: Diagnosis not present

## 2022-08-30 DIAGNOSIS — R55 Syncope and collapse: Secondary | ICD-10-CM | POA: Diagnosis not present

## 2022-09-07 DIAGNOSIS — H8110 Benign paroxysmal vertigo, unspecified ear: Secondary | ICD-10-CM | POA: Diagnosis not present

## 2022-09-07 DIAGNOSIS — R42 Dizziness and giddiness: Secondary | ICD-10-CM | POA: Diagnosis not present

## 2022-10-25 DIAGNOSIS — L718 Other rosacea: Secondary | ICD-10-CM | POA: Diagnosis not present

## 2022-10-25 DIAGNOSIS — Z808 Family history of malignant neoplasm of other organs or systems: Secondary | ICD-10-CM | POA: Diagnosis not present

## 2022-10-25 DIAGNOSIS — L578 Other skin changes due to chronic exposure to nonionizing radiation: Secondary | ICD-10-CM | POA: Diagnosis not present

## 2022-10-25 DIAGNOSIS — L218 Other seborrheic dermatitis: Secondary | ICD-10-CM | POA: Diagnosis not present

## 2022-10-25 DIAGNOSIS — Z08 Encounter for follow-up examination after completed treatment for malignant neoplasm: Secondary | ICD-10-CM | POA: Diagnosis not present

## 2022-10-25 DIAGNOSIS — D692 Other nonthrombocytopenic purpura: Secondary | ICD-10-CM | POA: Diagnosis not present

## 2022-10-25 DIAGNOSIS — D225 Melanocytic nevi of trunk: Secondary | ICD-10-CM | POA: Diagnosis not present

## 2022-10-25 DIAGNOSIS — L821 Other seborrheic keratosis: Secondary | ICD-10-CM | POA: Diagnosis not present

## 2022-10-25 DIAGNOSIS — Z85828 Personal history of other malignant neoplasm of skin: Secondary | ICD-10-CM | POA: Diagnosis not present

## 2022-11-11 DIAGNOSIS — D649 Anemia, unspecified: Secondary | ICD-10-CM | POA: Diagnosis not present

## 2022-11-15 DIAGNOSIS — D649 Anemia, unspecified: Secondary | ICD-10-CM | POA: Diagnosis not present

## 2022-11-15 DIAGNOSIS — R42 Dizziness and giddiness: Secondary | ICD-10-CM | POA: Diagnosis not present

## 2022-11-15 DIAGNOSIS — R55 Syncope and collapse: Secondary | ICD-10-CM | POA: Diagnosis not present

## 2022-11-15 DIAGNOSIS — I6521 Occlusion and stenosis of right carotid artery: Secondary | ICD-10-CM | POA: Diagnosis not present

## 2022-11-19 DIAGNOSIS — H8120 Vestibular neuronitis, unspecified ear: Secondary | ICD-10-CM | POA: Diagnosis not present

## 2022-11-19 DIAGNOSIS — R42 Dizziness and giddiness: Secondary | ICD-10-CM | POA: Diagnosis not present

## 2022-11-25 DIAGNOSIS — Z0000 Encounter for general adult medical examination without abnormal findings: Secondary | ICD-10-CM | POA: Diagnosis not present

## 2022-11-25 DIAGNOSIS — Z1231 Encounter for screening mammogram for malignant neoplasm of breast: Secondary | ICD-10-CM | POA: Diagnosis not present

## 2022-11-25 DIAGNOSIS — L209 Atopic dermatitis, unspecified: Secondary | ICD-10-CM | POA: Diagnosis not present

## 2022-11-25 DIAGNOSIS — R42 Dizziness and giddiness: Secondary | ICD-10-CM | POA: Diagnosis not present

## 2022-11-25 DIAGNOSIS — E782 Mixed hyperlipidemia: Secondary | ICD-10-CM | POA: Diagnosis not present

## 2022-11-25 DIAGNOSIS — D649 Anemia, unspecified: Secondary | ICD-10-CM | POA: Diagnosis not present

## 2022-12-23 DIAGNOSIS — R42 Dizziness and giddiness: Secondary | ICD-10-CM | POA: Diagnosis not present

## 2022-12-23 DIAGNOSIS — H8120 Vestibular neuronitis, unspecified ear: Secondary | ICD-10-CM | POA: Diagnosis not present

## 2022-12-23 DIAGNOSIS — R519 Headache, unspecified: Secondary | ICD-10-CM | POA: Diagnosis not present

## 2023-01-07 DIAGNOSIS — H8120 Vestibular neuronitis, unspecified ear: Secondary | ICD-10-CM | POA: Diagnosis not present

## 2023-01-07 DIAGNOSIS — R42 Dizziness and giddiness: Secondary | ICD-10-CM | POA: Diagnosis not present

## 2023-01-21 DIAGNOSIS — D6489 Other specified anemias: Secondary | ICD-10-CM | POA: Diagnosis not present

## 2023-01-21 DIAGNOSIS — D649 Anemia, unspecified: Secondary | ICD-10-CM | POA: Diagnosis not present

## 2023-01-21 DIAGNOSIS — Z79899 Other long term (current) drug therapy: Secondary | ICD-10-CM | POA: Diagnosis not present
